# Patient Record
Sex: Female | Born: 1980
Health system: Southern US, Community
[De-identification: ages and names within clinical notes are randomized; demographics above are authoritative.]

## PROBLEM LIST (undated history)

## (undated) DIAGNOSIS — F4310 Post-traumatic stress disorder, unspecified: Secondary | ICD-10-CM

## (undated) DIAGNOSIS — F319 Bipolar disorder, unspecified: Secondary | ICD-10-CM

## (undated) DIAGNOSIS — F419 Anxiety disorder, unspecified: Secondary | ICD-10-CM

---

## 2014-08-12 ENCOUNTER — Emergency Department (HOSPITAL_COMMUNITY)
Admission: EM | Admit: 2014-08-12 | Discharge: 2014-08-12 | Disposition: A | Discharge status: Home / Self Care | Payer: Medicare Other | Attending: Emergency Medicine | Admitting: Emergency Medicine

## 2014-08-12 ENCOUNTER — Encounter (HOSPITAL_COMMUNITY): Payer: Self-pay | Admitting: Neurology

## 2014-08-12 DIAGNOSIS — Z720 Tobacco use: Secondary | ICD-10-CM | POA: Insufficient documentation

## 2014-08-12 DIAGNOSIS — Z8659 Personal history of other mental and behavioral disorders: Secondary | ICD-10-CM | POA: Diagnosis not present

## 2014-08-12 DIAGNOSIS — N390 Urinary tract infection, site not specified: Secondary | ICD-10-CM

## 2014-08-12 DIAGNOSIS — R300 Dysuria: Secondary | ICD-10-CM | POA: Diagnosis present

## 2014-08-12 HISTORY — DX: Anxiety disorder, unspecified: F41.9

## 2014-08-12 HISTORY — DX: Post-traumatic stress disorder, unspecified: F43.10

## 2014-08-12 HISTORY — DX: Bipolar disorder, unspecified: F31.9

## 2014-08-12 LAB — URINE MICROSCOPIC-ADD ON

## 2014-08-12 LAB — URINALYSIS, ROUTINE W REFLEX MICROSCOPIC
Glucose, UA: 250 mg/dL — AB
Hgb urine dipstick: NEGATIVE
KETONES UR: 15 mg/dL — AB
Nitrite: POSITIVE — AB
PROTEIN: NEGATIVE mg/dL
SPECIFIC GRAVITY, URINE: 1.026 (ref 1.005–1.030)
UROBILINOGEN UA: 2 mg/dL — AB (ref 0.0–1.0)
pH: 5 (ref 5.0–8.0)

## 2014-08-12 MED ORDER — FLUCONAZOLE 200 MG PO TABS: 200.0000 mg | ORAL_TABLET | Freq: Every day | ORAL | Status: AC

## 2014-08-12 MED ORDER — CEPHALEXIN 500 MG PO CAPS: 500.0000 mg | ORAL_CAPSULE | Freq: Four times a day (QID) | ORAL | Status: DC

## 2014-08-12 NOTE — ED Provider Notes (Signed)
CSN: [id number]     Arrival date & time 08/12/14  1034 History  This chart was scribed for Comer Locket, PA-C, working with Ernestina Patches, MD by Steva Colder, ED Scribe. The patient was seen in room TR01C/TR01C at 3:10 PM.    Chief Complaint  Patient presents with  . Urinary Tract Infection      The history is provided by the patient. No language interpreter was used.    HPI Comments: Alexis Steele is a 34 y.o. female who presents to the Emergency Department complaining of UTI onset 3 days. Pt reports that she has had a known kidney stone in left kidney for a year that she is unsure if she passed. Pt thinks that she may have an UTI at this time because of her present symptoms. Pt recently moved to the area and does not have a urologist or PCP. She states that she is having associated symptoms of dysuria, malodorous urine, dark urine. Rates discomfort as 6/10. She denies vaginal discharge, vaginal bleeding, abdominal pain, fever, and any other symptoms. Pt is allergic to augmentin and will have hives. No other modifying factors.      Past Medical History  Diagnosis Date  . Bipolar disorder   . PTSD (post-traumatic stress disorder)   . Anxiety    History reviewed. No pertinent past surgical history. No family history on file. History  Substance Use Topics  . Smoking status: Current Every Day Smoker  . Smokeless tobacco: Not on file  . Alcohol Use: Yes   OB History    No data available     Review of Systems  Gastrointestinal: Negative for abdominal pain.  Genitourinary: Positive for dysuria. Negative for vaginal bleeding, vaginal discharge and pelvic pain.  All other systems reviewed and are negative.     Allergies  Augmentin  Home Medications   Prior to Admission medications   Medication Sig Start Date End Date Taking? Authorizing Provider  cephALEXin (KEFLEX) 500 MG capsule Take 1 capsule (500 mg total) by mouth 4 (four) times daily. 08/12/14   Comer Locket,  PA-C  fluconazole (DIFLUCAN) 200 MG tablet Take 1 tablet (200 mg total) by mouth daily. 08/12/14 08/19/14  Comer Locket, PA-C   BP 119/79 mmHg  Pulse 85  Temp(Src) 98.7 F (37.1 C) (Oral)  Resp 18  Ht 5\' 11"  (1.803 m)  Wt 150 lb (68.04 kg)  BMI 20.93 kg/m2  SpO2 98% Physical Exam  Constitutional: She is oriented to person, place, and time. She appears well-developed and well-nourished. No distress.  HENT:  Head: Normocephalic and atraumatic.  Eyes: EOM are normal.  Neck: Neck supple. No tracheal deviation present.  Cardiovascular: Normal rate.   Pulmonary/Chest: Effort normal. No respiratory distress.  Abdominal: Soft. There is tenderness in the suprapubic area. There is no CVA tenderness.  Musculoskeletal: Normal range of motion.  Neurological: She is alert and oriented to person, place, and time.  Skin: Skin is warm and dry.  Psychiatric: She has a normal mood and affect. Her behavior is normal.  Nursing note and vitals reviewed.   ED Course  Procedures (including critical care time) DIAGNOSTIC STUDIES: Oxygen Saturation is 95% on RA, adequate by my interpretation.    COORDINATION OF CARE: 3:13 PM-Discussed treatment plan with pt at bedside and pt agreed to plan.   Labs Review Labs Reviewed  URINALYSIS, ROUTINE W REFLEX MICROSCOPIC (NOT AT [facility]) - Abnormal; Notable for the following:    Color, Urine ORANGE (*)    APPearance TURBID (*)  Glucose, UA 250 (*)    Bilirubin Urine SMALL (*)    Ketones, ur 15 (*)    Urobilinogen, UA 2.0 (*)    Nitrite POSITIVE (*)    Leukocytes, UA MODERATE (*)    All other components within normal limits  URINE MICROSCOPIC-ADD ON - Abnormal; Notable for the following:    Squamous Epithelial / LPF MANY (*)    Bacteria, UA MANY (*)    Casts HYALINE CASTS (*)    All other components within normal limits    Imaging Review No results found.   EKG Interpretation None     Meds given in ED:  Medications - No data to  display  New Prescriptions   CEPHALEXIN (KEFLEX) 500 MG CAPSULE    Take 1 capsule (500 mg total) by mouth 4 (four) times daily.   FLUCONAZOLE (DIFLUCAN) 200 MG TABLET    Take 1 tablet (200 mg total) by mouth daily.   Filed Vitals:   08/12/14 1044 08/12/14 1335 08/12/14 1521  BP: 107/74 134/76 119/79  Pulse: 101 87 85  Temp: 98.7 F (37.1 C)    TempSrc: Oral    Resp: 20 18 18   Height: 5\' 11"  (1.803 m)    Weight: 150 lb (68.04 kg)    SpO2: 95% 95% 98%   MDM  Vitals stable - WNL -afebrile Pt resting comfortably in ED. PE--mild discomfort with palpation to suprapubic region. Otherwise benign abdominal exam. No CVA tenderness. Labwork--evidence of UTI on urinalysis.  DDX--no evidence of urosepsis, infected stone, PID, TOA or other systemic infection. Patient discomfort likely secondary to UTI. Will DC with antibiotic's. Patient requests prescription for Diflucan, we will oblige. Also given referral to call health and wellness for follow-up. No evidence of other acute or emergent pathology. Patient stable and is appropriate for discharge.  I discussed all relevant lab findings and imaging results with pt and they verbalized understanding. Discussed f/u with PCP within 48 hrs and return precautions, pt very amenable to plan.  Final diagnoses:  UTI (lower urinary tract infection)   I personally performed the services described in this documentation, which was scribed in my presence. The recorded information has been reviewed and is accurate.    Comer Locket, PA-C 08/12/14 8329  Ernestina Patches, MD 08/14/14 1059

## 2014-08-12 NOTE — ED Notes (Signed)
Pt not in the main ED waiting room with no response

## 2014-08-12 NOTE — ED Notes (Addendum)
Pt reports dark urine and foul odor for several weeks with burning with urination. Reports known kidney stone for a year.

## 2014-08-12 NOTE — Discharge Instructions (Signed)
You were evaluated in the ED today for your smelly urine. You were found to have UTI. Please take your antibiotic's as directed. Please follow-up with health and wellness for definitive care. Return to ED for worsening symptoms.  Urinary Tract Infection Urinary tract infections (UTIs) can develop anywhere along your urinary tract. Your urinary tract is your body's drainage system for removing wastes and extra water. Your urinary tract includes two kidneys, two ureters, a bladder, and a urethra. Your kidneys are a pair of bean-shaped organs. Each kidney is about the size of your fist. They are located below your ribs, one on each side of your spine. CAUSES Infections are caused by microbes, which are microscopic organisms, including fungi, viruses, and bacteria. These organisms are so small that they can only be seen through a microscope. Bacteria are the microbes that most commonly cause UTIs. SYMPTOMS  Symptoms of UTIs may vary by age and gender of the patient and by the location of the infection. Symptoms in young women typically include a frequent and intense urge to urinate and a painful, burning feeling in the bladder or urethra during urination. Older women and men are more likely to be tired, shaky, and weak and have muscle aches and abdominal pain. A fever may mean the infection is in your kidneys. Other symptoms of a kidney infection include pain in your back or sides below the ribs, nausea, and vomiting. DIAGNOSIS To diagnose a UTI, your caregiver will ask you about your symptoms. Your caregiver also will ask to provide a urine sample. The urine sample will be tested for bacteria and white blood cells. White blood cells are made by your body to help fight infection. TREATMENT  Typically, UTIs can be treated with medication. Because most UTIs are caused by a bacterial infection, they usually can be treated with the use of antibiotics. The choice of antibiotic and length of treatment depend on  your symptoms and the type of bacteria causing your infection. HOME CARE INSTRUCTIONS  If you were prescribed antibiotics, take them exactly as your caregiver instructs you. Finish the medication even if you feel better after you have only taken some of the medication.  Drink enough water and fluids to keep your urine clear or pale yellow.  Avoid caffeine, tea, and carbonated beverages. They tend to irritate your bladder.  Empty your bladder often. Avoid holding urine for long periods of time.  Empty your bladder before and after sexual intercourse.  After a bowel movement, women should cleanse from front to back. Use each tissue only once. SEEK MEDICAL CARE IF:   You have back pain.  You develop a fever.  Your symptoms do not begin to resolve within 3 days. SEEK IMMEDIATE MEDICAL CARE IF:   You have severe back pain or lower abdominal pain.  You develop chills.  You have nausea or vomiting.  You have continued burning or discomfort with urination. MAKE SURE YOU:   Understand these instructions.  Will watch your condition.  Will get help right away if you are not doing well or get worse. Document Released: 10/25/2004 Document Revised: 07/17/2011 Document Reviewed: 02/23/2011 [facility] Patient Information 2015 [place], [place]. This information is not intended to replace advice given to you by your health care provider. Make sure you discuss any questions you have with your health care provider.

## 2015-01-15 ENCOUNTER — Encounter (HOSPITAL_COMMUNITY): Payer: Self-pay | Admitting: Emergency Medicine

## 2015-01-15 ENCOUNTER — Emergency Department (HOSPITAL_COMMUNITY): Payer: Medicare Other

## 2015-01-15 ENCOUNTER — Emergency Department (HOSPITAL_COMMUNITY)
Admission: EM | Admit: 2015-01-15 | Discharge: 2015-01-15 | Disposition: A | Discharge status: Home / Self Care | Payer: Medicare Other | Attending: Emergency Medicine | Admitting: Emergency Medicine

## 2015-01-15 DIAGNOSIS — M62838 Other muscle spasm: Secondary | ICD-10-CM | POA: Insufficient documentation

## 2015-01-15 DIAGNOSIS — F4310 Post-traumatic stress disorder, unspecified: Secondary | ICD-10-CM | POA: Insufficient documentation

## 2015-01-15 DIAGNOSIS — F17200 Nicotine dependence, unspecified, uncomplicated: Secondary | ICD-10-CM | POA: Insufficient documentation

## 2015-01-15 DIAGNOSIS — Y92410 Unspecified street and highway as the place of occurrence of the external cause: Secondary | ICD-10-CM | POA: Diagnosis not present

## 2015-01-15 DIAGNOSIS — S0592XA Unspecified injury of left eye and orbit, initial encounter: Secondary | ICD-10-CM | POA: Insufficient documentation

## 2015-01-15 DIAGNOSIS — F419 Anxiety disorder, unspecified: Secondary | ICD-10-CM | POA: Diagnosis not present

## 2015-01-15 DIAGNOSIS — Z79899 Other long term (current) drug therapy: Secondary | ICD-10-CM | POA: Insufficient documentation

## 2015-01-15 DIAGNOSIS — S199XXA Unspecified injury of neck, initial encounter: Secondary | ICD-10-CM | POA: Diagnosis not present

## 2015-01-15 DIAGNOSIS — Y998 Other external cause status: Secondary | ICD-10-CM | POA: Diagnosis not present

## 2015-01-15 DIAGNOSIS — Y9389 Activity, other specified: Secondary | ICD-10-CM | POA: Insufficient documentation

## 2015-01-15 DIAGNOSIS — F319 Bipolar disorder, unspecified: Secondary | ICD-10-CM | POA: Diagnosis not present

## 2015-01-15 MED ORDER — METHOCARBAMOL 500 MG PO TABS: 500.0000 mg | ORAL_TABLET | Freq: Two times a day (BID) | ORAL | Status: DC

## 2015-01-15 MED ORDER — KETOROLAC TROMETHAMINE 30 MG/ML IJ SOLN
30.0000 mg | Freq: Once | INTRAMUSCULAR | Status: AC
  Administered 2015-01-15: 30 mg via INTRAVENOUS
  Filled 2015-01-15: qty 1

## 2015-01-15 MED ORDER — OXYCODONE-ACETAMINOPHEN 5-325 MG PO TABS: 1.0000 | ORAL_TABLET | ORAL | Status: DC | PRN

## 2015-01-15 MED ORDER — ONDANSETRON HCL 4 MG/2ML IJ SOLN
4.0000 mg | Freq: Once | INTRAMUSCULAR | Status: AC
  Administered 2015-01-15: 4 mg via INTRAVENOUS
  Filled 2015-01-15: qty 2

## 2015-01-15 MED ORDER — FENTANYL CITRATE (PF) 100 MCG/2ML IJ SOLN
100.0000 ug | Freq: Once | INTRAMUSCULAR | Status: AC
  Administered 2015-01-15: 100 ug via INTRAVENOUS
  Filled 2015-01-15: qty 2

## 2015-01-15 MED ORDER — OXYCODONE-ACETAMINOPHEN 5-325 MG PO TABS
1.0000 | ORAL_TABLET | Freq: Once | ORAL | Status: AC
Start: 2015-01-15 — End: 2015-01-15
  Administered 2015-01-15: 1 via ORAL
  Filled 2015-01-15: qty 1

## 2015-01-15 MED ORDER — IBUPROFEN 800 MG PO TABS: 800.0000 mg | ORAL_TABLET | Freq: Three times a day (TID) | ORAL | Status: DC
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
## 2015-01-15 NOTE — ED Notes (Signed)
PA @ bedside.
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
## 2015-01-15 NOTE — ED Notes (Signed)
Pt given water and ice pack after discussed with Dr. Tyrone Nine.

## 2015-02-17 DIAGNOSIS — M25531 Pain in right wrist: Secondary | ICD-10-CM | POA: Diagnosis not present

## 2015-02-17 DIAGNOSIS — A630 Anogenital (venereal) warts: Secondary | ICD-10-CM | POA: Diagnosis not present

## 2015-02-17 DIAGNOSIS — M21619 Bunion of unspecified foot: Secondary | ICD-10-CM | POA: Diagnosis not present

## 2015-03-02 DIAGNOSIS — F3132 Bipolar disorder, current episode depressed, moderate: Secondary | ICD-10-CM | POA: Diagnosis not present

## 2015-03-02 DIAGNOSIS — F4310 Post-traumatic stress disorder, unspecified: Secondary | ICD-10-CM | POA: Diagnosis not present

## 2015-03-30 DIAGNOSIS — M21612 Bunion of left foot: Secondary | ICD-10-CM | POA: Diagnosis not present

## 2015-03-30 DIAGNOSIS — M21611 Bunion of right foot: Secondary | ICD-10-CM | POA: Diagnosis not present

## 2015-04-14 DIAGNOSIS — F4310 Post-traumatic stress disorder, unspecified: Secondary | ICD-10-CM | POA: Diagnosis not present

## 2015-04-14 DIAGNOSIS — F3132 Bipolar disorder, current episode depressed, moderate: Secondary | ICD-10-CM | POA: Diagnosis not present

## 2015-05-02 DIAGNOSIS — M21612 Bunion of left foot: Secondary | ICD-10-CM | POA: Diagnosis not present

## 2015-05-02 DIAGNOSIS — M21611 Bunion of right foot: Secondary | ICD-10-CM | POA: Diagnosis not present

## 2015-05-04 ENCOUNTER — Other Ambulatory Visit: Payer: Medicare Other

## 2015-05-25 ENCOUNTER — Encounter: Payer: Self-pay | Admitting: *Deleted

## 2015-05-26 ENCOUNTER — Other Ambulatory Visit: Payer: Medicare Other

## 2015-06-13 ENCOUNTER — Encounter: Payer: Medicare Other | Admitting: Obstetrics and Gynecology

## 2015-06-14 ENCOUNTER — Other Ambulatory Visit: Payer: Self-pay

## 2015-06-14 ENCOUNTER — Other Ambulatory Visit: Payer: Self-pay | Admitting: Internal Medicine

## 2015-06-14 DIAGNOSIS — K740 Hepatic fibrosis: Secondary | ICD-10-CM | POA: Diagnosis not present

## 2015-06-14 DIAGNOSIS — Z7251 High risk heterosexual behavior: Secondary | ICD-10-CM

## 2015-06-14 DIAGNOSIS — B182 Chronic viral hepatitis C: Secondary | ICD-10-CM | POA: Diagnosis not present

## 2015-06-14 LAB — PROTIME-INR
INR: 1.05 (ref <1.50)
Prothrombin Time: 13.8 seconds (ref 11.6–15.2)

## 2015-06-15 LAB — IRON: Iron: 185 ug/dL (ref 40–190)

## 2015-06-15 LAB — HEPATITIS B CORE ANTIBODY, TOTAL: HEP B C TOTAL AB: NONREACTIVE

## 2015-06-15 LAB — HEPATITIS B SURFACE ANTIGEN: Hepatitis B Surface Ag: NEGATIVE

## 2015-06-15 LAB — HEPATITIS A ANTIBODY, TOTAL: HEP A TOTAL AB: NONREACTIVE

## 2015-06-15 LAB — HEPATITIS B SURFACE ANTIBODY,QUALITATIVE: HEP B S AB: POSITIVE — AB

## 2015-06-16 DIAGNOSIS — N898 Other specified noninflammatory disorders of vagina: Secondary | ICD-10-CM | POA: Diagnosis not present

## 2015-06-16 DIAGNOSIS — K759 Inflammatory liver disease, unspecified: Secondary | ICD-10-CM | POA: Diagnosis not present

## 2015-06-16 DIAGNOSIS — Z720 Tobacco use: Secondary | ICD-10-CM | POA: Diagnosis not present

## 2015-06-16 DIAGNOSIS — A630 Anogenital (venereal) warts: Secondary | ICD-10-CM | POA: Diagnosis not present

## 2015-06-16 DIAGNOSIS — M21619 Bunion of unspecified foot: Secondary | ICD-10-CM | POA: Diagnosis not present

## 2015-06-17 LAB — LIVER FIBROSIS, FIBROTEST-ACTITEST
ALPHA-2-MACROGLOBULIN: 172 mg/dL (ref 106–279)
ALT: 75 U/L — ABNORMAL HIGH (ref 6–29)
Apolipoprotein A1: 154 mg/dL (ref 101–198)
Bilirubin: 0.4 mg/dL (ref 0.2–1.2)
FIBROSIS SCORE: 0.09
GGT: 42 U/L (ref 3–50)
Haptoglobin: 135 mg/dL (ref 43–212)
NECROINFLAMMAT ACT SCORE: 0.37
REFERENCE ID: 1524235

## 2015-06-29 ENCOUNTER — Encounter: Payer: Medicare Other | Admitting: Internal Medicine

## 2015-07-11 ENCOUNTER — Encounter: Payer: Self-pay | Admitting: Family Medicine

## 2015-07-11 ENCOUNTER — Ambulatory Visit (INDEPENDENT_AMBULATORY_CARE_PROVIDER_SITE_OTHER): Payer: Medicare Other | Admitting: Family Medicine

## 2015-07-11 VITALS — BP 116/87 | HR 88 | Ht 71.0 in | Wt 165.0 lb

## 2015-07-11 DIAGNOSIS — B1920 Unspecified viral hepatitis C without hepatic coma: Secondary | ICD-10-CM | POA: Insufficient documentation

## 2015-07-11 DIAGNOSIS — B182 Chronic viral hepatitis C: Secondary | ICD-10-CM | POA: Diagnosis not present

## 2015-07-11 DIAGNOSIS — R87612 Low grade squamous intraepithelial lesion on cytologic smear of cervix (LGSIL): Secondary | ICD-10-CM

## 2015-07-11 DIAGNOSIS — A630 Anogenital (venereal) warts: Secondary | ICD-10-CM | POA: Insufficient documentation

## 2015-07-11 DIAGNOSIS — B977 Papillomavirus as the cause of diseases classified elsewhere: Secondary | ICD-10-CM

## 2015-07-11 DIAGNOSIS — IMO0002 Reserved for concepts with insufficient information to code with codable children: Secondary | ICD-10-CM

## 2015-07-11 DIAGNOSIS — R87629 Unspecified abnormal cytological findings in specimens from vagina: Secondary | ICD-10-CM

## 2015-07-11 DIAGNOSIS — R87810 Cervical high risk human papillomavirus (HPV) DNA test positive: Secondary | ICD-10-CM | POA: Diagnosis not present

## 2015-07-11 NOTE — Progress Notes (Signed)
Patient ID: Alexis Steele, female   DOB: [birth year], 35 y.o.   MRN: [id number]   CLINIC ENCOUNTER NOTE  History:  35 y.o.  here today for possible colposcopy. 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  Patient reports 2 prior abnormal pap smears at age 35 and 35. She also had an abdominal hysterectomy at age 35 for endometriosis.  She denies any abnormal vaginal discharge, bleeding, pelvic pain or other concerns. She has warts on the right lower aspect of her vagina that she would like treated.   Past Medical History  Diagnosis Date  . Bipolar disorder ([place])   . PTSD (post-traumatic stress disorder)   . Anxiety     No past surgical history on file.  The following portions of the patient's history were reviewed and updated as appropriate: allergies, current medications, past family history, past medical history, past social history, past surgical history and problem list.    Review of Systems:  Pertinent items noted in HPI and remainder of comprehensive ROS otherwise negative.  Objective:  Physical Exam BP 116/87 mmHg  Pulse 88  Ht 5\' 11"  (1.803 m)  Wt 165 lb (74.844 kg)  BMI 23.02 kg/m2 CONSTITUTIONAL: Well-developed, well-nourished female in no acute distress.  HENT:  Normocephalic, atraumatic. External right and left ear normal. Oropharynx is clear and moist EYES: Conjunctivae and EOM are normal. Pupils are equal, round, and reactive to light. No scleral icterus.  NECK: Normal range of motion, supple, no masses SKIN: Skin is warm and dry. No rash noted. Not diaphoretic. No erythema. No pallor. [place]: Alert and oriented to person, place, and time. Normal reflexes, muscle tone coordination. No cranial nerve deficit noted. PSYCHIATRIC: Normal mood and affect. Normal behavior. Normal judgment and thought content. CARDIOVASCULAR: Normal heart rate noted RESPIRATORY: Effort and breath sounds normal, no problems with respiration noted ABDOMEN: Soft, no distention noted.     PELVIC: Normal appearing external genitalia. Several small warts at base of vagina.  Bimanual exam performed and unable to feel cervix. Speculum exam confirmed surgically absent cervix. Normal appearing vaginal mucosa without lesion. No abnormal discharge noted. No adnexal tenderness. MUSCULOSKELETAL: Normal range of motion. No edema noted.  Labs and Imaging No results found.  Assessment & Plan:  1. Abnormal Pap smear of vagina - No overt lesions on vaginal walls. Given hysterectomy with removal of the cervix patient does not need biopsies at this time. Discussed with Dr. Roselie Awkward who was in agreement with plan to repeat pap in 1 year.  - Repeat pap in 1 year  2. Genital warts -Treated with TCA today - Repeat in 4-6 weeks if not improved.    Routine preventative health maintenance measures emphasized. Please refer to After Visit Summary for other counseling recommendations.   Return in about 4 weeks (around 08/08/2015), or if symptoms worsen or fail to improve.  Total face-to-face time with patient: 30 minutes. Over 50% of encounter was spent on counseling and coordination of care.

## 2015-07-11 NOTE — Patient Instructions (Signed)
Genital Warts Genital warts are a common STD (sexually transmitted disease). They may appear as small bumps on the tissues of the genital area or anal area. Sometimes, they can become irritated and cause pain. Genital warts are easily passed to other people through sexual contact. Getting treatment is important because genital warts can lead to other problems. In females, the virus that causes genital warts may increase the risk of cervical cancer. CAUSES Genital warts are caused by a virus that is called human papillomavirus (HPV). HPV is spread by having unprotected sex with an infected person. It can be spread through vaginal, anal, and oral sex. Many people do not know that they are infected. They may be infected for years without problems. However, even if they do not have problems, they can pass the infection to their sexual partners. RISK FACTORS Genital warts are more likely to develop in:  People who have unprotected sex.  People who have multiple sexual partners.  People who become sexually active before they are 37 years of age.  Men who are not circumcised.  Women who have a female sexual partner who is not circumcised.  People who have a weakened body defense system (immune system) due to disease or medicine.  People who smoke. SYMPTOMS Symptoms of genital warts include:  Small growths in the genital area or anal area. These warts often grow in clusters.  Itching and irritation in the genital area or anal area.  Bleeding from the warts.  Painful sexual intercourse. DIAGNOSIS Genital warts can usually be diagnosed from their appearance on the vagina, vulva, penis, perineum, anus, or rectum. Tests may also be done, such as:  Biopsy. A tissue sample is removed so it can be looked at under a microscope.  Colposcopy. In females, a magnifying tool is used to examine the vagina and cervix. Certain solutions may be used to make the HPV cells change color so they can be seen more  easily.  A Pap test in females.  Tests for other STDs. TREATMENT Treatment for genital warts may include:  Applying prescription medicines to the warts. These may be solutions or creams.  Freezing the warts with liquid nitrogen (cryotherapy).  Burning the warts with:  Laser treatment.  An electrified probe (electrocautery).  Injecting a substance (Candida antigen or Trichophyton antigen) into the warts to help the body's immune system to fight off the warts.  Interferon injections.  Surgery to remove the warts. HOME CARE INSTRUCTIONS Medicines  Apply over-the-counter and prescription medicines only as told by your health care provider.  Do not treat genital warts with medicines that are used for treating hand warts.  Talk with your health care provider about using over-the-counter anti-itch creams. General Instructions  Do not touch or scratch the warts.  Do not have sex until your treatment has been completed.  Tell your current and past sexual partners about your condition because they may also need treatment.  Keep all follow-up visits as told by your health care provider. This is important.  After treatment, use condoms during sex to prevent future infections. Other Instructions for Women  Women who have genital warts might need increased screening for cervical cancer. This type of cancer is slow growing and can be cured if it is found early. Chances of developing cervical cancer are increased with HPV.  If you become pregnant, tell your health care provider that you have had HPV. Your health care provider will monitor you closely during pregnancy to be sure that your  baby is safe. PREVENTION Talk with your health care provider about getting the HPV vaccines. These vaccines prevent some HPV infections and cancers. It is recommended that the vaccine be given to males and females who are 9-26 years of age. It will not work if you already have HPV, and it is not  recommended for pregnant women. SEEK MEDICAL CARE IF:  You have redness, swelling, or pain in the area of the treated skin.  You have a fever.  You feel generally ill.  You feel lumps in and around your genital area or anal area.  You have bleeding in your genital area or anal area.  You have pain during sexual intercourse.   This information is not intended to replace advice given to you by your health care provider. Make sure you discuss any questions you have with your health care provider.   Document Released: 01/13/2000 Document Revised: 10/06/2014 Document Reviewed: 04/12/2014 Elsevier Interactive Patient Education 2016 [employer]vier Inc.  

## 2015-07-12 DIAGNOSIS — F4310 Post-traumatic stress disorder, unspecified: Secondary | ICD-10-CM | POA: Diagnosis not present

## 2015-07-14 ENCOUNTER — Ambulatory Visit (INDEPENDENT_AMBULATORY_CARE_PROVIDER_SITE_OTHER): Payer: Medicare Other | Admitting: Internal Medicine

## 2015-07-14 ENCOUNTER — Encounter: Payer: Self-pay | Admitting: Internal Medicine

## 2015-07-14 VITALS — BP 136/83 | HR 101 | Temp 98.6°F | Wt 163.0 lb

## 2015-07-14 DIAGNOSIS — Z7251 High risk heterosexual behavior: Secondary | ICD-10-CM | POA: Diagnosis not present

## 2015-07-14 DIAGNOSIS — Z23 Encounter for immunization: Secondary | ICD-10-CM

## 2015-07-14 DIAGNOSIS — B182 Chronic viral hepatitis C: Secondary | ICD-10-CM | POA: Diagnosis not present

## 2015-07-14 LAB — CBC WITH DIFFERENTIAL/PLATELET
BASOS ABS: 44 {cells}/uL (ref 0–200)
Basophils Relative: 1 %
Eosinophils Absolute: 88 cells/uL (ref 15–500)
Eosinophils Relative: 2 %
HCT: 40.9 % (ref 35.0–45.0)
HEMOGLOBIN: 14.3 g/dL (ref 11.7–15.5)
LYMPHS ABS: 1804 {cells}/uL (ref 850–3900)
Lymphocytes Relative: 41 %
MCH: 33.5 pg — AB (ref 27.0–33.0)
MCHC: 35 g/dL (ref 32.0–36.0)
MCV: 95.8 fL (ref 80.0–100.0)
MONOS PCT: 8 %
MPV: 10.3 fL (ref 7.5–12.5)
Monocytes Absolute: 352 cells/uL (ref 200–950)
NEUTROS ABS: 2112 {cells}/uL (ref 1500–7800)
Neutrophils Relative %: 48 %
Platelets: 289 10*3/uL (ref 140–400)
RBC: 4.27 MIL/uL (ref 3.80–5.10)
RDW: 12.9 % (ref 11.0–15.0)
WBC: 4.4 10*3/uL (ref 3.8–10.8)

## 2015-07-14 NOTE — Progress Notes (Signed)
Patient ID: Alexis Steele, female   DOB: 09/11/1980, 35 y.o.   MRN: [id number]    [facility] for Infectious Disease   CC: consideration for treatment for chronic hepatitis C  HPI:  Alexis Steele is a 35 y.o. female with history of bipolar disease, PTSD who presents for initial evaluation and management of chronic hepatitis C.  Patient tested positive initially last year. Hepatitis C-associated risk factors present are: she had injection drug use, last used 12 months ago. Patient denies no longer using illicit drug. Patient has had other studies performed. Results: fibrosure which showed no fibrosis. Patient has not had prior treatment for Hepatitis C. Patient does have a past history of liver disease. Patient does not have have a family history of liver disease. Patient does not have associated signs or symptoms related to liver disease.   Records reviewed from epic and primary care. She receives her [facility] health care from [facility]  Patient does not have documented immunity to Hepatitis A. Patient does have documented immunity to Hepatitis B.    Review of Systems:   rash, involving torso, extremities, face occasionally All other systems reviewed and are negative      Past Medical History  Diagnosis Date  . Bipolar disorder ([place])   . PTSD (post-traumatic stress disorder)   . Anxiety   - hx of kidney stone - s/p bladder surgery, removal of scar tissue  Prior to Admission medications   Medication Sig Start Date End Date Taking? Authorizing Provider  Brexpiprazole (REXULTI) 1 MG TABS Take 1 tablet by mouth daily.   Yes Historical Provider, MD  busPIRone (BUSPAR) 10 MG tablet Take 10 mg by mouth 3 (three) times daily.   Yes Historical Provider, MD  gabapentin (NEURONTIN) 400 MG capsule Take 400 mg by mouth 4 (four) times daily.   Yes Historical Provider, MD  lamoTRIgine (LAMICTAL) 200 MG tablet Take 200 mg by mouth at bedtime.   Yes Historical Provider, MD  PARoxetine (PAXIL) 40 MG tablet  Take 40 mg by mouth every morning.   Yes Historical Provider, MD  QUEtiapine (SEROQUEL) 300 MG tablet Take 300 mg by mouth at bedtime.   Yes Historical Provider, MD  sucralfate (CARAFATE) 1 g tablet Take 1 g by mouth 4 (four) times daily.   Yes Historical Provider, MD    Allergies  Allergen Reactions  . Augmentin [Amoxicillin-Pot Clavulanate]     Social History  Substance Use Topics  . Smoking status: Current Every Day Smoker  . Smokeless tobacco: None  . Alcohol Use: Yes  - 2 drinks in the last 30 days  No family history on file.    Objective:  BP 136/83 mmHg  Pulse 101  Temp(Src) 98.6 F (37 C) (Oral)  Wt 163 lb (73.936 kg) Physical Exam  Constitutional:  oriented to person, place, and time. appears well-developed and well-nourished. No distress.  HENT: [place]/AT, PERRLA, no scleral icterus Mouth/Throat: Oropharynx is clear and moist. No oropharyngeal exudate.  Cardiovascular: Normal rate, regular rhythm and normal heart sounds. Exam reveals no gallop and no friction rub.  No murmur heard.  Pulmonary/Chest: Effort normal and breath sounds normal. No respiratory distress.  has no wheezes.  Neck = supple, no nuchal rigidity Abdominal: Soft. Bowel sounds are normal.  exhibits no distension. There is no tenderness.  Lymphadenopathy: no cervical adenopathy. No axillary adenopathy Neurological: alert and oriented to person, place, and time.  Skin: Skin is warm and dry. No rash noted. Subtle blanching erythema to side of neck.  Top of quads. Psychiatric: a normal mood and affect.  behavior is normal.   Laboratory Genotype: No results found for: HCVGENOTYPE HCV viral load: No results found for: HCVQUANT No results found for: WBC, HGB, HCT, MCV, PLT No results found for: CREATININE, BUN, NA, K, CL, CO2  Lab Results  Component Value Date   ALT 75* 06/14/2015     Labs and history reviewed and show CHILD-PUGH   5-6 points: Child class A 7-9 points: Child class B 10-15  points: Child class C  Lab Results  Component Value Date   INR 1.05 06/14/2015    Assessment: New Patient with Chronic Hepatitis C ,without hepatic coma, untreated.  I discussed with the patient the lab findings that confirm chronic hepatitis C as well as the natural history and progression of disease including about 30% of people who develop cirrhosis of the liver if left untreated and once cirrhosis is established there is a 2-7% risk per year of liver cancer and liver failure.  I discussed the importance of treatment and benefits in reducing the risk, even if significant liver fibrosis exists.   Plan: 1) Patient counseled extensively on limiting acetaminophen to no more than 2 grams daily, avoidance of alcohol. 2) Transmission discussed with patient including sexual transmission, sharing razors and toothbrush.   3) Will need referral for substance abuse counseling: no. Currently getting [facility]. Will drug test at next visit to ensure no active illicit drug use. Reportedly she has been abstinent/drug free x 12 months 4) Will prescribe Harvoni for 12 weeks if she has detectable viral load 6) Hepatitis A vaccine : yes, 1st dose today 7) Hepatitis B vaccine: not needed 8) Pneumovax vaccine if concern for cirrhosis 9) Further work up to include liver staging with elastography 10) will follow up after review of lab tests to determine if need to start tx

## 2015-07-15 LAB — COMPLETE METABOLIC PANEL WITH GFR
ALBUMIN: 4.5 g/dL (ref 3.6–5.1)
ALK PHOS: 97 U/L (ref 33–115)
ALT: 145 U/L — ABNORMAL HIGH (ref 6–29)
AST: 75 U/L — ABNORMAL HIGH (ref 10–30)
BILIRUBIN TOTAL: 0.3 mg/dL (ref 0.2–1.2)
BUN: 8 mg/dL (ref 7–25)
CO2: 23 mmol/L (ref 20–31)
Calcium: 9.5 mg/dL (ref 8.6–10.2)
Chloride: 107 mmol/L (ref 98–110)
Creat: 0.78 mg/dL (ref 0.50–1.10)
Glucose, Bld: 78 mg/dL (ref 65–99)
Potassium: 4.2 mmol/L (ref 3.5–5.3)
Sodium: 140 mmol/L (ref 135–146)
TOTAL PROTEIN: 7.5 g/dL (ref 6.1–8.1)

## 2015-07-15 LAB — HEPATITIS C RNA QUANTITATIVE
HCV Quantitative Log: 5.06 {Log} — ABNORMAL HIGH (ref <1.18)
HCV Quantitative: 114883 IU/mL — ABNORMAL HIGH (ref <15)

## 2015-07-15 LAB — HEPATITIS C ANTIBODY: HCV Ab: REACTIVE — AB

## 2015-07-19 LAB — HEPATITIS C RNA QUANTITATIVE
HCV QUANT LOG: 5.15 {Log} — AB (ref <1.18)
HCV Quantitative: 140647 IU/mL — ABNORMAL HIGH (ref <15)

## 2015-07-19 LAB — HEPATITIS C GENOTYPE

## 2015-07-26 ENCOUNTER — Telehealth: Payer: Self-pay

## 2015-08-10 ENCOUNTER — Encounter: Payer: Self-pay | Admitting: Internal Medicine

## 2015-08-10 ENCOUNTER — Ambulatory Visit (INDEPENDENT_AMBULATORY_CARE_PROVIDER_SITE_OTHER): Payer: Medicare Other | Admitting: Internal Medicine

## 2015-08-10 VITALS — BP 113/80 | HR 93 | Temp 98.6°F | Wt 165.0 lb

## 2015-08-10 DIAGNOSIS — B182 Chronic viral hepatitis C: Secondary | ICD-10-CM | POA: Diagnosis not present

## 2015-08-10 MED ORDER — SOFOSBUVIR-VELPATASVIR 400-100 MG PO TABS: 1.0000 | ORAL_TABLET | Freq: Every day | ORAL | Status: AC

## 2015-08-10 NOTE — Progress Notes (Signed)
  RFV: follow up for chronic hepatitis c Subjective:    Patient ID: Alexis Steele, female    DOB: 10-17-1980, 35 y.o.   MRN: [id number]  HPI  35yo F with chronic hepatitis c without hepatic coma. GT2b. She is doing well since last visit. She is starting to attend counseling at [facility] who also manages bipolar disease. She is still undergoing bereavement since death of her mother. She maintains sobriety for 15 months. No IVDU.  Allergies  Allergen Reactions  . Augmentin [Amoxicillin-Pot Clavulanate]   ' Current Outpatient Prescriptions on File Prior to Visit  Medication Sig Dispense Refill  . Brexpiprazole (REXULTI) 1 MG TABS Take 1 tablet by mouth daily.    . busPIRone (BUSPAR) 10 MG tablet Take 10 mg by mouth 3 (three) times daily.    Marland Kitchen gabapentin (NEURONTIN) 400 MG capsule Take 400 mg by mouth 4 (four) times daily.    Marland Kitchen lamoTRIgine (LAMICTAL) 200 MG tablet Take 200 mg by mouth at bedtime.    Marland Kitchen PARoxetine (PAXIL) 40 MG tablet Take 40 mg by mouth every morning.    Marland Kitchen QUEtiapine (SEROQUEL) 300 MG tablet Take 300 mg by mouth at bedtime.    . sucralfate (CARAFATE) 1 g tablet Take 1 g by mouth 4 (four) times daily.     No current facility-administered medications on file prior to visit.   Active Ambulatory Problems    Diagnosis Date Noted  . Abnormal Pap smear of vagina and vaginal HPV 07/11/2015  . Genital warts 07/11/2015  . Hepatitis C 07/11/2015   Resolved Ambulatory Problems    Diagnosis Date Noted  . No Resolved Ambulatory Problems   Past Medical History  Diagnosis Date  . Bipolar disorder ([place])   . PTSD (post-traumatic stress disorder)   . Anxiety      Review of Systems + anxiety/depression. Ongoing lip lesion.    Objective:   Physical Exam BP 113/80 mmHg  Pulse 93  Temp(Src) 98.6 F (37 C) (Oral)  Wt 165 lb (74.844 kg) Physical Exam  Constitutional:  oriented to person, place, and time. appears well-developed and well-nourished. No distress.  HENT: [place]/AT, PERRLA,  no scleral icterus. Left corner of lip, raised, pedunculated lesion Mouth/Throat: Oropharynx is clear and moist. No oropharyngeal exudate.  Cardiovascular: Normal rate, regular rhythm and normal heart sounds. Exam reveals no gallop and no friction rub.  No murmur heard.  Pulmonary/Chest: Effort normal and breath sounds normal. No respiratory distress.  has no wheezes.  Neck = supple, no nuchal rigidity Abdominal: Soft. Bowel sounds are normal.  exhibits no distension. There is no tenderness.  Lymphadenopathy: no cervical adenopathy. No axillary adenopathy Neurological: alert and oriented to person, place, and time.  Skin: Skin is warm and dry. No rash noted. No erythema. tattoos  Psychiatric: a normal mood and affect.  behavior is normal.       Assessment & Plan:  Chronic hepatitis c without hepatic coma- limit acetominophen and alcohol use. Will also ask her to cut down on drinking energy drinks (unclear if there would be any drug interaction with hep c tx) Will apply for epclusa  - will give counseling on how to take medicaiton  GERD = will ask her to stop carafate if symptoms are not significant  hpv lip lesion = recommend to see dermatologist for assessment and treatment since near corner of mouth, [place]million border

## 2015-08-12 MED FILL — *EPCLUSA 400 MG-100 MG TAB: 400-100 | 28 days supply | Qty: 28 | Fill #0

## 2015-08-15 ENCOUNTER — Encounter: Payer: Self-pay | Admitting: Pharmacy Technician

## 2015-08-16 ENCOUNTER — Encounter: Payer: Self-pay | Admitting: Pharmacy Technician

## 2015-08-25 DIAGNOSIS — K121 Other forms of stomatitis: Secondary | ICD-10-CM | POA: Diagnosis not present

## 2015-08-31 ENCOUNTER — Ambulatory Visit (INDEPENDENT_AMBULATORY_CARE_PROVIDER_SITE_OTHER): Payer: Medicare Other | Admitting: Pharmacist Clinician (PhC)/ Clinical Pharmacy Specialist

## 2015-08-31 ENCOUNTER — Other Ambulatory Visit: Payer: Medicare Other

## 2015-08-31 DIAGNOSIS — B1920 Unspecified viral hepatitis C without hepatic coma: Secondary | ICD-10-CM | POA: Diagnosis not present

## 2015-08-31 DIAGNOSIS — B182 Chronic viral hepatitis C: Secondary | ICD-10-CM | POA: Diagnosis present

## 2015-08-31 NOTE — Progress Notes (Signed)
Patient ID: Alexis Steele, female   DOB: September 20, 1980, 35 y.o.   MRN: [id number] Agreed with Renee's note. Alexis Steele got lab too soon because she mistakenly thought it was supposed to be done today. We can repeat at the next appt if needed.

## 2015-08-31 NOTE — Progress Notes (Signed)
Patient ID: Alexis Steele, female   DOB: [birth year], 35 y.o.   MRN: [id number]  HPI: Alexis Steele is a 35 y.o. female who presents for follow-up of chronic hepatitis C, genotype 2b. Patient tested positive initially last year. Hepatitis C-associated risk factors: IV drug use, last used over 12 months ago. Patient denies current illicit drug use. She started [place] on 7/19.  Lab Results  Component Value Date   HCVGENOTYPE 2b 07/14/2015    Allergies: Allergies  Allergen Reactions  . Augmentin [Amoxicillin-Pot Clavulanate]     Past Medical History: Past Medical History:  Diagnosis Date  . Anxiety   . Bipolar disorder ([place])   . PTSD (post-traumatic stress disorder)     Social History: Social History   Social History  . Marital status: Single    Spouse name: N/A  . Number of children: N/A  . Years of education: N/A   Social History Main Topics  . Smoking status: Current Every Day Smoker  . Smokeless tobacco: Not on file  . Alcohol use Yes  . Drug use: Unknown  . Sexual activity: Not on file   Other Topics Concern  . Not on file   Social History Narrative  . No narrative on file    Labs: Hep B S Ab (no units)  Date Value  06/14/2015 POS (A)   Hepatitis B Surface Ag (no units)  Date Value  06/14/2015 NEGATIVE   HCV Ab (no units)  Date Value  07/14/2015 REACTIVE (A)    Lab Results  Component Value Date   HCVGENOTYPE 2b 07/14/2015    Hepatitis C RNA quantitative Latest Ref Rng & Units 07/14/2015 07/14/2015  HCV Quantitative <15 IU/mL 140,647(H) 114,883(H)  HCV Quantitative Log <1.18 log 10 5.15(H) 5.06(H)    AST (U/L)  Date Value  07/14/2015 75 (H)   ALT (U/L)  Date Value  07/14/2015 145 (H)  06/14/2015 75 (H)   INR (no units)  Date Value  06/14/2015 1.05    CrCl: CrCl cannot be calculated (Unknown ideal weight.).  Fibrosis Score: 0.09 - F0  Previous Treatment Regimen: none  Assessment: Patient reports not missing any doses; takes at  11pm every day. Denies any side effects associated with Epclusa. Endorses headaches, fatigue, and constipation since cutting back on caffeine. Advised her to use ibuprofen prn headaches and docusate or Miralax prn constipation. She reports stopping Carafate to avoid interaction with Epclusa. Reviewed all medications; no other interactions.  Although she was not scheduled, patient ended up getting labs today.  Recommendations: -Continue Epclusa for total 12 week duration -F/u today's labs -F/u visit with Dr. Baxter Flattery on 8/17   Gwenlyn Perking, PharmD PGY1 Pharmacy Resident Pager: [phone] 08/31/2015 12:42 PM

## 2015-08-31 NOTE — Patient Instructions (Signed)
Cont Epclusa 1 daily x 3 mo Come back to see Dr. Baxter Flattery in mid August

## 2015-09-01 ENCOUNTER — Ambulatory Visit: Payer: Medicare Other

## 2015-09-02 LAB — HEPATITIS C RNA QUANTITATIVE: HCV Quantitative: NOT DETECTED IU/mL (ref <15)

## 2015-09-08 MED FILL — *EPCLUSA 400 MG-100 MG TAB: 400-100 | 28 days supply | Qty: 28 | Fill #1

## 2015-09-15 ENCOUNTER — Ambulatory Visit: Payer: Medicare Other | Admitting: Internal Medicine

## 2015-09-15 ENCOUNTER — Telehealth: Payer: Self-pay | Admitting: Pharmacist Clinician (PhC)/ Clinical Pharmacy Specialist

## 2015-09-15 NOTE — Telephone Encounter (Signed)
Alexis Steele was supposed to come today to get repeated hep C VL today but had car trouble. She accidentally got too soon at the pharmacy appt on 8/2 after only 2 wks of Epclusa. The VL came back undetectable even after only 2 wks. Told that she doesn't need to come in today but make sure to f/u with Dr. Baxter Flattery in Oct.

## 2015-09-27 DIAGNOSIS — F3132 Bipolar disorder, current episode depressed, moderate: Secondary | ICD-10-CM | POA: Diagnosis not present

## 2015-09-27 DIAGNOSIS — F4310 Post-traumatic stress disorder, unspecified: Secondary | ICD-10-CM | POA: Diagnosis not present

## 2015-10-05 MED FILL — *EPCLUSA 400 MG-100 MG TAB: 400-100 | 28 days supply | Qty: 28 | Fill #2

## 2015-10-26 DIAGNOSIS — D2339 Other benign neoplasm of skin of other parts of face: Secondary | ICD-10-CM | POA: Diagnosis not present

## 2015-10-26 DIAGNOSIS — L300 Nummular dermatitis: Secondary | ICD-10-CM | POA: Diagnosis not present

## 2015-10-26 DIAGNOSIS — D100 Benign neoplasm of lip: Secondary | ICD-10-CM | POA: Diagnosis not present

## 2015-11-01 DIAGNOSIS — B079 Viral wart, unspecified: Secondary | ICD-10-CM | POA: Diagnosis not present

## 2015-11-08 ENCOUNTER — Ambulatory Visit (INDEPENDENT_AMBULATORY_CARE_PROVIDER_SITE_OTHER): Payer: Medicare Other | Admitting: Internal Medicine

## 2015-11-08 ENCOUNTER — Encounter: Payer: Self-pay | Admitting: Internal Medicine

## 2015-11-08 VITALS — BP 126/83 | HR 97 | Temp 98.6°F | Wt 178.0 lb

## 2015-11-08 DIAGNOSIS — B182 Chronic viral hepatitis C: Secondary | ICD-10-CM | POA: Diagnosis not present

## 2015-11-08 LAB — COMPLETE METABOLIC PANEL WITH GFR
ALT: 12 U/L (ref 6–29)
AST: 16 U/L (ref 10–30)
Albumin: 4.1 g/dL (ref 3.6–5.1)
Alkaline Phosphatase: 68 U/L (ref 33–115)
BUN: 13 mg/dL (ref 7–25)
CO2: 24 mmol/L (ref 20–31)
Calcium: 8.9 mg/dL (ref 8.6–10.2)
Chloride: 107 mmol/L (ref 98–110)
Creat: 0.8 mg/dL (ref 0.50–1.10)
GFR, Est African American: >89 mL/min (ref >=60)
GFR, Est Non African American: >89 mL/min (ref >=60)
Glucose, Bld: 103 mg/dL — ABNORMAL HIGH (ref 65–99)
Potassium: 4 mmol/L (ref 3.5–5.3)
Sodium: 140 mmol/L (ref 135–146)
Total Bilirubin: 0.2 mg/dL (ref 0.2–1.2)
Total Protein: 6.6 g/dL (ref 6.1–8.1)

## 2015-11-08 NOTE — Progress Notes (Signed)
RFV: follow up for hepatitis C treatment  Patient ID: Alexis Steele, female   DOB: 20-Apr-1980, 35 y.o.   MRN: [id number]  HPI Alexis Steele is a 35yo F with chronic hepatitis C without hepatic coma, GT2b, with VL undetectable after 2 wks of tx. She is currently taking sof-vel has been taking her meds consistently and is close to finishing out her course. Tolerating without difficulty.  Outpatient Encounter Prescriptions as of 11/08/2015  Medication Sig  . Brexpiprazole (REXULTI) 1 MG TABS Take 1 tablet by mouth daily.  . busPIRone (BUSPAR) 10 MG tablet Take 10 mg by mouth 3 (three) times daily.  Marland Kitchen gabapentin (NEURONTIN) 400 MG capsule Take 400 mg by mouth 4 (four) times daily.  Marland Kitchen lamoTRIgine (LAMICTAL) 200 MG tablet Take 200 mg by mouth at bedtime.  Marland Kitchen PARoxetine (PAXIL) 40 MG tablet Take 40 mg by mouth every morning.  Marland Kitchen QUEtiapine (SEROQUEL) 300 MG tablet Take 300 mg by mouth at bedtime.  . Sofosbuvir-Velpatasvir 400-100 MG TABS Take 1 tablet by mouth daily.  . sucralfate (CARAFATE) 1 g tablet Take 1 g by mouth 4 (four) times daily.   No facility-administered encounter medications on file as of 11/08/2015.      Patient Active Problem List   Diagnosis Date Noted  . Abnormal Pap smear of vagina and vaginal HPV 07/11/2015  . Genital warts 07/11/2015  . Hepatitis C 07/11/2015     Health Maintenance Due  Topic Date Due  . HIV Screening  04/06/1993  . TETANUS/TDAP  04/06/1997  . PAP SMEAR  04/07/1999  . INFLUENZA VACCINE  08/30/2015     Review of Systems Review of Systems  Constitutional: Negative for fever, chills, diaphoresis, activity change, appetite change, fatigue and unexpected weight change.  HENT: Negative for congestion, sore throat, rhinorrhea, sneezing, trouble swallowing and sinus pressure.  Eyes: Negative for photophobia and visual disturbance.  Respiratory: Negative for cough, chest tightness, shortness of breath, wheezing and stridor.  Cardiovascular: Negative for  chest pain, palpitations and leg swelling.  Gastrointestinal: Negative for nausea, vomiting, abdominal pain, diarrhea, constipation, blood in stool, abdominal distention and anal bleeding.  Genitourinary: Negative for dysuria, hematuria, flank pain and difficulty urinating.  Musculoskeletal: Negative for myalgias, back pain, joint swelling, arthralgias and gait problem.  Skin: Negative for color change, pallor, rash and wound.  Neurological: Negative for dizziness, tremors, weakness and light-headedness.  Hematological: Negative for adenopathy. Does not bruise/bleed easily.  Psychiatric/Behavioral: Negative for behavioral problems, confusion, sleep disturbance, dysphoric mood, decreased concentration and agitation.    Physical Exam   BP 126/83   Pulse 97   Temp 98.6 F (37 C) (Oral)   Wt 80.7 kg (178 lb)   BMI 24.83 kg/m   Physical Exam  Constitutional:  oriented to person, place, and time. appears well-developed and well-nourished. No distress.  HENT: [place]/AT, PERRLA, no scleral icterus Mouth/Throat: Oropharynx is clear and moist. No oropharyngeal exudate.  Cardiovascular: Normal rate, regular rhythm and normal heart sounds. Exam reveals no gallop and no friction rub.  No murmur heard.  Pulmonary/Chest: Effort normal and breath sounds normal. No respiratory distress.  has no wheezes.  Neck = supple, no nuchal rigidity Abdominal: Soft. Bowel sounds are normal.  exhibits no distension. There is no tenderness.  Lymphadenopathy: no cervical adenopathy. No axillary adenopathy Neurological: alert and oriented to person, place, and time.  Skin: Skin is warm and dry. No rash noted. No erythema.  Psychiatric: a normal mood and affect.  behavior is normal.   Lab Results  Component Value Date   HEPBSAB POS (A) 06/14/2015   No results found for: RPR  CBC Lab Results  Component Value Date   WBC 4.4 07/14/2015   RBC 4.27 07/14/2015   HGB 14.3 07/14/2015   HCT 40.9 07/14/2015   PLT 289  07/14/2015   MCV 95.8 07/14/2015   MCH 33.5 (H) 07/14/2015   MCHC 35.0 07/14/2015   RDW 12.9 07/14/2015   LYMPHSABS 1,804 07/14/2015   MONOABS 352 07/14/2015   EOSABS 88 07/14/2015   BASOSABS 44 07/14/2015   BMET Lab Results  Component Value Date   NA 140 07/14/2015   K 4.2 07/14/2015   CL 107 07/14/2015   CO2 23 07/14/2015   GLUCOSE 78 07/14/2015   BUN 8 07/14/2015   CREATININE 0.78 07/14/2015   CALCIUM 9.5 07/14/2015   GFRNONAA >89 07/14/2015   GFRAA >89 07/14/2015     Assessment and Plan   Chronic hepatitis c without hepatic coma = will get blood work today. Have her come back in 12 wk for svr 12 and will repeat US at next 3-6 months  Avoid reinfection, alcohol use, tylenol use

## 2015-11-09 LAB — HEPATITIS C RNA QUANTITATIVE: HCV QUANT: NOT DETECTED [IU]/mL (ref <15)

## 2015-11-09 LAB — AFP TUMOR MARKER: AFP-Tumor Marker: 3.9 ng/mL (ref <6.1)

## 2015-11-16 NOTE — Telephone Encounter (Signed)
Enter in error

## 2015-12-19 ENCOUNTER — Telehealth: Payer: Self-pay | Admitting: *Deleted

## 2015-12-19 NOTE — Telephone Encounter (Signed)
Patient called for the results of her hep c viral load. Notified patient currently undetectable and also gave her the appt information for January with Dr. Baxter Flattery. She is aware she will have a repeat lab at that time. Myrtis Hopping

## 2015-12-20 ENCOUNTER — Telehealth: Payer: Self-pay

## 2015-12-20 NOTE — Telephone Encounter (Signed)
Voicemail from 12-19-15 Patient calling for lab results. She has completed the Hepatitis  C medication.   Laverle Patter, RN   Call completed on yesterday by Alexander Bergeron, CMA

## 2015-12-28 DIAGNOSIS — F4310 Post-traumatic stress disorder, unspecified: Secondary | ICD-10-CM | POA: Diagnosis not present

## 2016-01-11 DIAGNOSIS — F4310 Post-traumatic stress disorder, unspecified: Secondary | ICD-10-CM | POA: Diagnosis not present

## 2016-02-14 ENCOUNTER — Ambulatory Visit: Payer: Medicare Other | Admitting: Internal Medicine

## 2016-03-01 DIAGNOSIS — K219 Gastro-esophageal reflux disease without esophagitis: Secondary | ICD-10-CM | POA: Diagnosis not present

## 2016-03-01 DIAGNOSIS — M7550 Bursitis of unspecified shoulder: Secondary | ICD-10-CM | POA: Diagnosis not present

## 2016-03-06 ENCOUNTER — Ambulatory Visit (INDEPENDENT_AMBULATORY_CARE_PROVIDER_SITE_OTHER): Payer: Medicare Other | Admitting: Internal Medicine

## 2016-03-06 ENCOUNTER — Ambulatory Visit: Payer: Medicare Other

## 2016-03-06 ENCOUNTER — Encounter: Payer: Self-pay | Admitting: Internal Medicine

## 2016-03-06 VITALS — BP 114/82 | HR 76 | Temp 98.0°F | Ht 70.0 in | Wt 184.0 lb

## 2016-03-06 DIAGNOSIS — R300 Dysuria: Secondary | ICD-10-CM

## 2016-03-06 DIAGNOSIS — B182 Chronic viral hepatitis C: Secondary | ICD-10-CM

## 2016-03-06 DIAGNOSIS — Z23 Encounter for immunization: Secondary | ICD-10-CM | POA: Diagnosis not present

## 2016-03-06 NOTE — Progress Notes (Signed)
[facility]   RFV:  treatment for chronic hepatitis C  HPI:  +Alexis Steele is a 36 y.o. female with hronic hepatitis c without hepatic coma, GT2b, who finished out her treatment course at end of November, her VL was undetectable at that time. She is in clinic to do SVR 12 bloodwork. Fibrosis stage F0.   She is hep B immune, she received 1 dose of hep A   Review of Systems:   dysuria, urinary urgency concern for uti. Otherwise 10 point ros is negative     Past Medical History:  Diagnosis Date  . Anxiety   . Bipolar disorder ([place])   . PTSD (post-traumatic stress disorder)     Prior to Admission medications   Medication Sig Start Date End Date Taking? Authorizing Provider  busPIRone (BUSPAR) 10 MG tablet Take 10 mg by mouth 3 (three) times daily.   Yes Historical Provider, MD  gabapentin (NEURONTIN) 400 MG capsule Take 400 mg by mouth 4 (four) times daily.   Yes Historical Provider, MD  lamoTRIgine (LAMICTAL) 200 MG tablet Take 200 mg by mouth at bedtime.   Yes Historical Provider, MD  Oxcarbazepine (TRILEPTAL) 300 MG tablet Take 300 mg by mouth 2 (two) times daily.   Yes Historical Provider, MD  PARoxetine (PAXIL) 40 MG tablet Take 40 mg by mouth every morning.   Yes Historical Provider, MD  QUEtiapine (SEROQUEL) 300 MG tablet Take 300 mg by mouth at bedtime.   Yes Historical Provider, MD  sucralfate (CARAFATE) 1 g tablet Take 1 g by mouth 4 (four) times daily.   Yes Historical Provider, MD  Sofosbuvir-Velpatasvir 400-100 MG TABS Take 1 tablet by mouth daily. Patient not taking: Reported on 03/06/2016 08/10/15   Carlyle Basques, MD    Allergies  Allergen Reactions  . Augmentin [Amoxicillin-Pot Clavulanate]     Social History  Substance Use Topics  . Smoking status: Current Every Day Smoker  . Smokeless tobacco: Never Used  . Alcohol use Yes    No family history on file.   Objective:    Vitals:   03/06/16 1521  BP: 114/82  Pulse: 76    Temp: 98 F (36.7 C)   Constitutional:  oriented to person, place, and time. appears well-developed and well-nourished. No distress.  HENT: [place]/AT, PERRLA, no scleral icterus Mouth/Throat: Oropharynx is clear and moist. No oropharyngeal exudate.  Cardiovascular: Normal rate, regular rhythm and normal heart sounds. Exam reveals no gallop and no friction rub.  No murmur heard.  Pulmonary/Chest: Effort normal and breath sounds normal. No respiratory distress.  has no wheezes.  Neck = supple, no nuchal rigidity Abdominal: Soft. Bowel sounds are normal.  exhibits no distension. There is no tenderness.  Lymphadenopathy: no cervical adenopathy. No axillary adenopathy Neurological: alert and oriented to person, place, and time.  Skin: Skin is warm and dry. No rash noted. No erythema.  Psychiatric: a normal mood and affect.  behavior is normal.    Laboratory Genotype:  Lab Results  Component Value Date   HCVGENOTYPE 2b 07/14/2015   HCV viral load:  Lab Results  Component Value Date   HCVQUANT Not Detected 11/08/2015   Lab Results  Component Value Date   WBC 4.4 07/14/2015   HGB 14.3 07/14/2015   HCT 40.9 07/14/2015   MCV 95.8 07/14/2015   PLT 289 07/14/2015    Lab Results  Component Value Date   CREATININE 0.80 11/08/2015   BUN 13 11/08/2015   NA 140  11/08/2015   K 4.0 11/08/2015   CL 107 11/08/2015   CO2 24 11/08/2015    Lab Results  Component Value Date   ALT 12 11/08/2015   AST 16 11/08/2015   ALKPHOS 68 11/08/2015     Lab Results  Component Value Date   INR 1.05 06/14/2015   BILITOT 0.2 11/08/2015   ALBUMIN 4.1 11/08/2015     Assessment: New Patient with Chronic Hepatitis C genotype 2b, treated in nov 2017.   Plan: 1) Patient counseled extensively on limiting acetaminophen to no more than 2 grams daily, avoidance of alcohol. 2)  Hepatitis A vaccine #2 today 3)  will follow up after 12 months  4) dysuria = will check ua and urine cx. She reported having  occasional dysuria, urgency improved today  Addendum: too many squamous cells thus needed recollection is still symptomatic

## 2016-03-08 LAB — HEPATITIS C RNA QUANTITATIVE
HCV QUANT LOG: NOT DETECTED {Log_IU}/mL
HCV Quantitative: <15 IU/mL

## 2016-03-12 ENCOUNTER — Other Ambulatory Visit: Payer: Medicare Other

## 2016-03-12 ENCOUNTER — Other Ambulatory Visit: Payer: Self-pay | Admitting: Internal Medicine

## 2016-03-12 DIAGNOSIS — R300 Dysuria: Secondary | ICD-10-CM

## 2016-03-13 LAB — URINALYSIS W MICROSCOPIC + REFLEX CULTURE
Bilirubin Urine: NEGATIVE
Crystals: NONE SEEN [HPF]
GLUCOSE, UA: NEGATIVE
HGB URINE DIPSTICK: NEGATIVE
Ketones, ur: NEGATIVE
NITRITE: NEGATIVE
PH: 6 (ref 5.0–8.0)
Protein, ur: NEGATIVE
SPECIFIC GRAVITY, URINE: 1.022 (ref 1.001–1.035)
YEAST: NONE SEEN [HPF]

## 2016-03-14 LAB — URINE CULTURE

## 2016-05-01 DIAGNOSIS — F4310 Post-traumatic stress disorder, unspecified: Secondary | ICD-10-CM | POA: Diagnosis not present

## 2016-06-28 DIAGNOSIS — X58XXXA Exposure to other specified factors, initial encounter: Secondary | ICD-10-CM | POA: Diagnosis not present

## 2016-06-28 DIAGNOSIS — R300 Dysuria: Secondary | ICD-10-CM | POA: Diagnosis not present

## 2016-06-28 DIAGNOSIS — K759 Inflammatory liver disease, unspecified: Secondary | ICD-10-CM | POA: Diagnosis not present

## 2016-06-28 DIAGNOSIS — Z7251 High risk heterosexual behavior: Secondary | ICD-10-CM | POA: Diagnosis not present

## 2016-06-28 DIAGNOSIS — Z8639 Personal history of other endocrine, nutritional and metabolic disease: Secondary | ICD-10-CM | POA: Diagnosis not present

## 2016-07-11 DIAGNOSIS — F4310 Post-traumatic stress disorder, unspecified: Secondary | ICD-10-CM | POA: Diagnosis not present

## 2016-08-28 DIAGNOSIS — R350 Frequency of micturition: Secondary | ICD-10-CM | POA: Diagnosis not present

## 2016-08-28 DIAGNOSIS — R3914 Feeling of incomplete bladder emptying: Secondary | ICD-10-CM | POA: Diagnosis not present

## 2016-08-28 DIAGNOSIS — R351 Nocturia: Secondary | ICD-10-CM | POA: Diagnosis not present
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
## 2017-02-23 DIAGNOSIS — S0181XA Laceration without foreign body of other part of head, initial encounter: Secondary | ICD-10-CM | POA: Diagnosis not present

## 2017-02-23 DIAGNOSIS — Z23 Encounter for immunization: Secondary | ICD-10-CM | POA: Diagnosis not present

## 2017-02-23 DIAGNOSIS — Y999 Unspecified external cause status: Secondary | ICD-10-CM | POA: Diagnosis not present

## 2017-02-23 DIAGNOSIS — Y92410 Unspecified street and highway as the place of occurrence of the external cause: Secondary | ICD-10-CM | POA: Diagnosis not present

## 2017-02-23 DIAGNOSIS — S01512A Laceration without foreign body of oral cavity, initial encounter: Secondary | ICD-10-CM | POA: Diagnosis not present

## 2017-04-25 DIAGNOSIS — L309 Dermatitis, unspecified: Secondary | ICD-10-CM | POA: Diagnosis not present

## 2017-04-25 DIAGNOSIS — K219 Gastro-esophageal reflux disease without esophagitis: Secondary | ICD-10-CM | POA: Diagnosis not present

## 2017-04-25 DIAGNOSIS — M549 Dorsalgia, unspecified: Secondary | ICD-10-CM | POA: Diagnosis not present

## 2017-10-21 DIAGNOSIS — S92352A Displaced fracture of fifth metatarsal bone, left foot, initial encounter for closed fracture: Secondary | ICD-10-CM | POA: Diagnosis not present

## 2017-10-21 DIAGNOSIS — W1789XA Other fall from one level to another, initial encounter: Secondary | ICD-10-CM | POA: Diagnosis not present

## 2017-10-21 DIAGNOSIS — S92355A Nondisplaced fracture of fifth metatarsal bone, left foot, initial encounter for closed fracture: Secondary | ICD-10-CM | POA: Diagnosis not present

## 2017-10-21 DIAGNOSIS — S9032XA Contusion of left foot, initial encounter: Secondary | ICD-10-CM | POA: Diagnosis not present

## 2017-10-21 DIAGNOSIS — Y999 Unspecified external cause status: Secondary | ICD-10-CM | POA: Diagnosis not present

## 2017-10-21 DIAGNOSIS — S99922A Unspecified injury of left foot, initial encounter: Secondary | ICD-10-CM | POA: Diagnosis not present

## 2017-10-24 DIAGNOSIS — S92355A Nondisplaced fracture of fifth metatarsal bone, left foot, initial encounter for closed fracture: Secondary | ICD-10-CM | POA: Diagnosis not present

## 2017-11-14 DIAGNOSIS — S92355A Nondisplaced fracture of fifth metatarsal bone, left foot, initial encounter for closed fracture: Secondary | ICD-10-CM | POA: Diagnosis not present

## 2018-09-26 DIAGNOSIS — Z202 Contact with and (suspected) exposure to infections with a predominantly sexual mode of transmission: Secondary | ICD-10-CM | POA: Diagnosis not present

## 2018-09-26 DIAGNOSIS — N760 Acute vaginitis: Secondary | ICD-10-CM | POA: Diagnosis not present

## 2018-09-26 DIAGNOSIS — Z113 Encounter for screening for infections with a predominantly sexual mode of transmission: Secondary | ICD-10-CM | POA: Diagnosis not present

## 2018-09-26 DIAGNOSIS — Z114 Encounter for screening for human immunodeficiency virus [HIV]: Secondary | ICD-10-CM | POA: Diagnosis not present

## 2019-10-01 ENCOUNTER — Other Ambulatory Visit: Payer: Self-pay | Admitting: Nurse Practitioner

## 2019-10-01 DIAGNOSIS — N898 Other specified noninflammatory disorders of vagina: Secondary | ICD-10-CM | POA: Diagnosis not present

## 2019-10-01 DIAGNOSIS — Z7251 High risk heterosexual behavior: Secondary | ICD-10-CM | POA: Diagnosis not present

## 2019-10-01 DIAGNOSIS — E669 Obesity, unspecified: Secondary | ICD-10-CM | POA: Diagnosis not present

## 2019-10-01 DIAGNOSIS — K219 Gastro-esophageal reflux disease without esophagitis: Secondary | ICD-10-CM | POA: Diagnosis not present

## 2019-10-01 DIAGNOSIS — Z0000 Encounter for general adult medical examination without abnormal findings: Secondary | ICD-10-CM | POA: Diagnosis not present

## 2020-01-21 ENCOUNTER — Other Ambulatory Visit: Payer: Self-pay | Admitting: Nurse Practitioner

## 2020-01-21 DIAGNOSIS — R5381 Other malaise: Secondary | ICD-10-CM

## 2020-03-17 ENCOUNTER — Other Ambulatory Visit: Payer: Self-pay | Admitting: Nurse Practitioner

## 2020-03-17 DIAGNOSIS — N6320 Unspecified lump in the left breast, unspecified quadrant: Secondary | ICD-10-CM

## 2020-05-02 ENCOUNTER — Ambulatory Visit
Admission: RE | Admit: 2020-05-02 | Discharge: 2020-05-02 | Disposition: A | Discharge status: Home / Self Care | Payer: Medicare Other | Source: Ambulatory Visit | Attending: Nurse Practitioner | Admitting: Nurse Practitioner

## 2020-05-02 ENCOUNTER — Other Ambulatory Visit: Payer: Self-pay

## 2020-05-02 DIAGNOSIS — N6320 Unspecified lump in the left breast, unspecified quadrant: Secondary | ICD-10-CM

## 2022-03-25 IMAGING — US US BREAST*L* LIMITED INC AXILLA
1 series · 7 of 7 positions shown · non-contrast
Comparison: None.

CLINICAL DATA: Mass felt by the patient in the upper inner left
breast for the past 2 years. She reports that this has increased
some in size and has become painful. Her sister was diagnosed with
breast cancer in her mid 40s.

EXAM:
DIGITAL DIAGNOSTIC BILATERAL MAMMOGRAM WITH TOMOSYNTHESIS AND CAD;
ULTRASOUND LEFT BREAST LIMITED
TECHNIQUE: Bilateral digital diagnostic mammography and breast tomosynthesis
was performed. The images were evaluated with computer-aided
detection.; Targeted ultrasound examination of the left breast was
performed

[Series 1: us breast*left* limited inc axilla · 0.07mm/px · 7 of 7 slices shown]
[im 1/7]
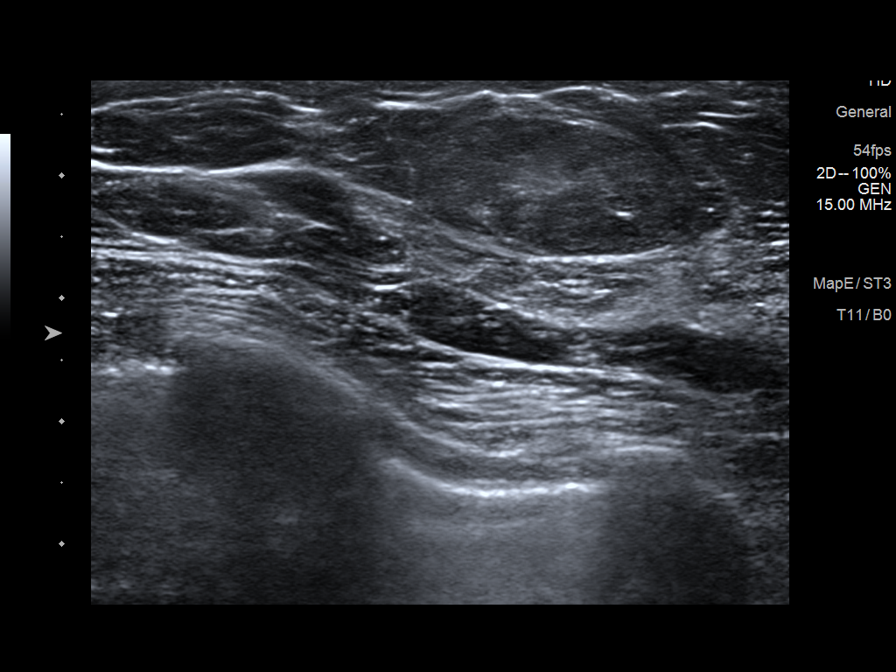
[im 2/7]
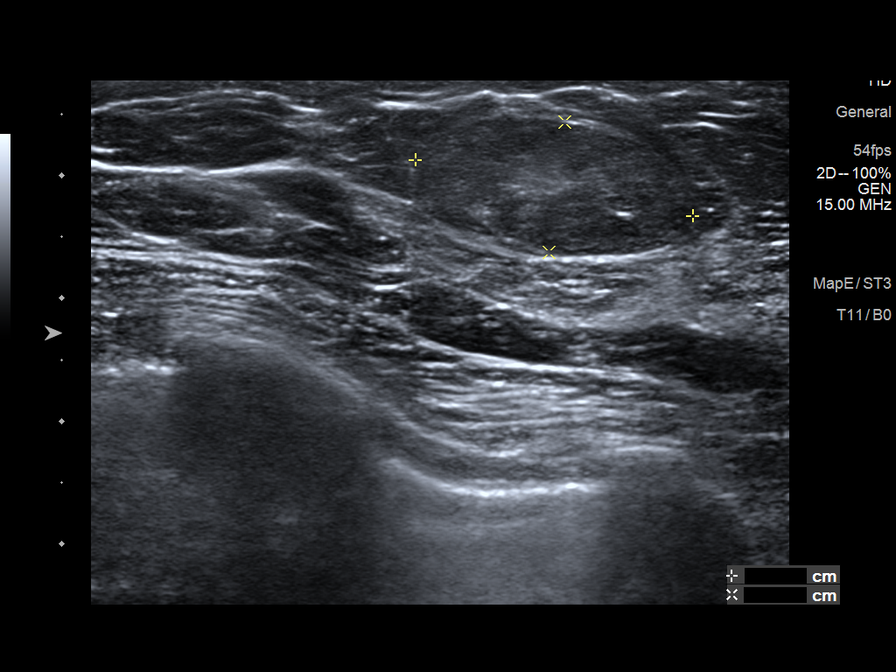
[im 3/7]
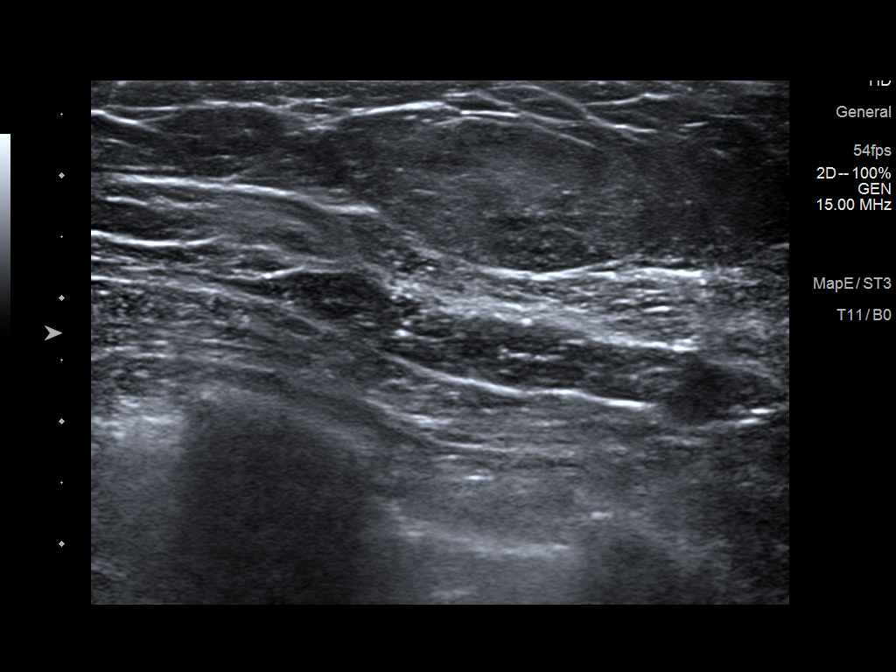
[im 4/7]
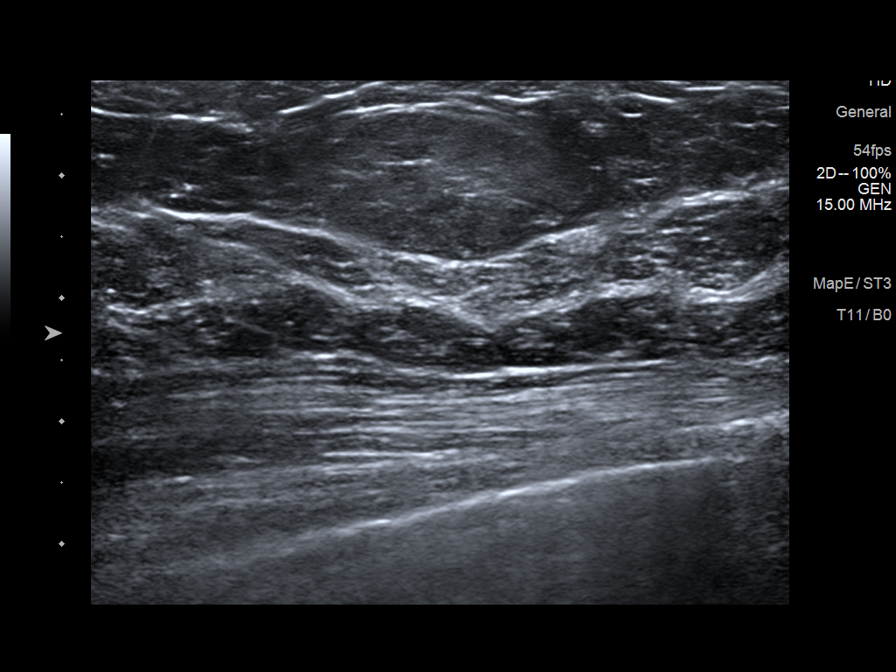
[im 5/7]
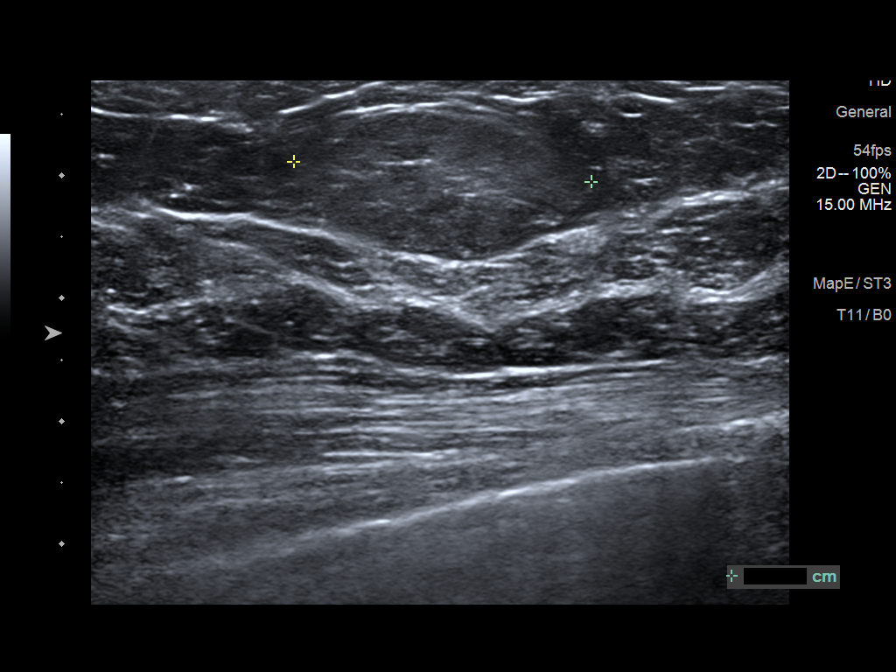
[im 6/7]
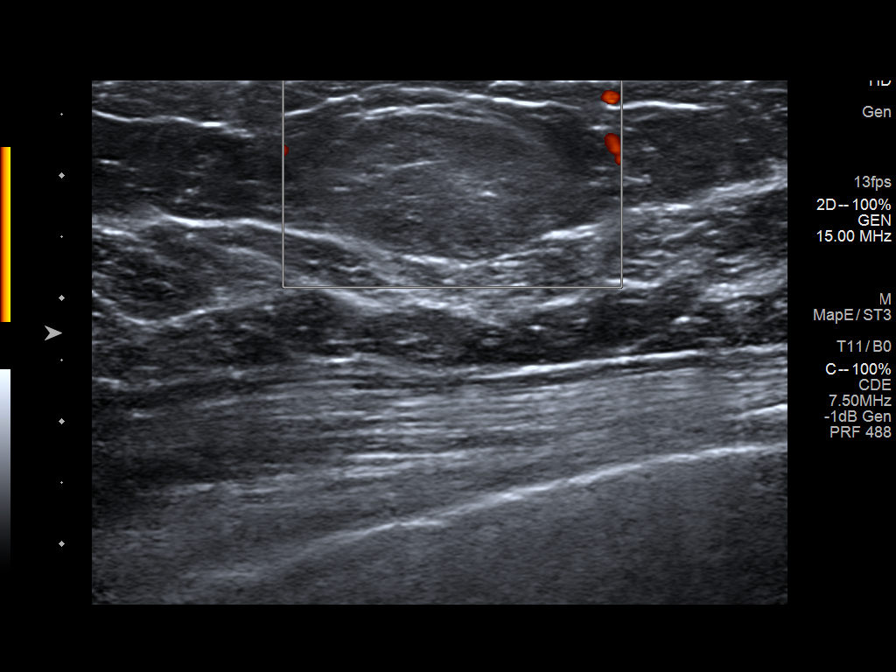
[im 7/7]
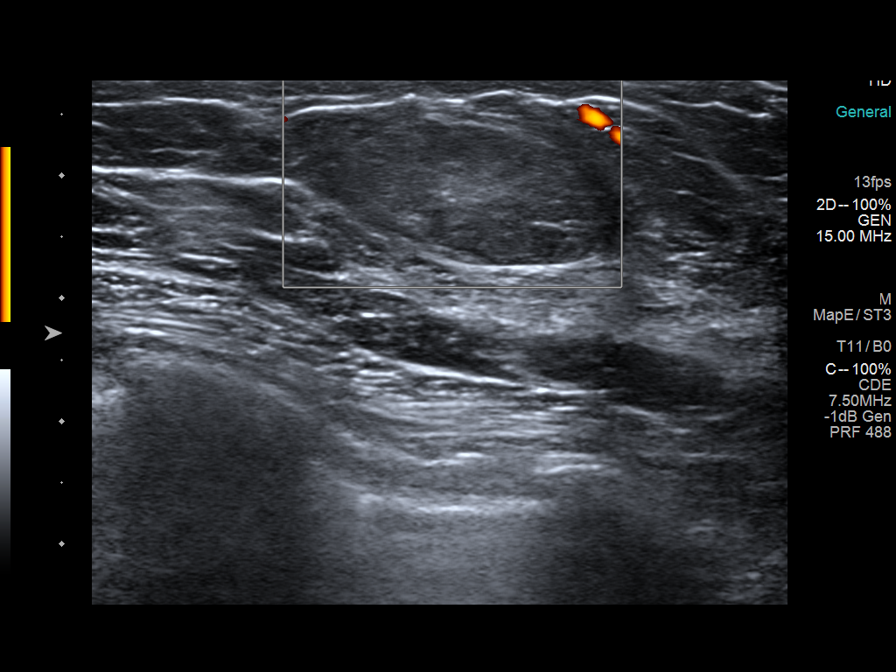

[7 of 7 positions shown; findings below may reference images not displayed]

Baseline.

ACR Breast Density Category b: There are scattered areas of
fibroglandular density.
FINDINGS: Oval, circumscribed, fat density mass with a thin surrounding
capsule in the upper inner left breast corresponding to the mass
felt by the patient, marked with a metallic marker. No findings in
either breast suspicious for malignancy.

On physical exam, the patient has an approximately 1.5 cm oval,
circumscribed, palpable mass in the 10:30 o'clock position of the
left breast, 5 cm from the nipple. This is tender to palpation.

Targeted ultrasound is performed, showing a 2.4 x 2.3 x 1.1 cm oval,
horizontally oriented, predominantly hypoechoic mass with some
ill-defined increased echogenicity in the 10:30 o'clock position of
the left breast, 5 cm from the nipple. This is within the
subcutaneous fat and corresponds to the palpable mass. No internal
blood flow was seen power Doppler.
IMPRESSION: 1. 2.4 cm palpable lipoma in the 10:30 o'clock position of the left
breast.
2. No evidence of malignancy in either breast.

RECOMMENDATION:
Bilateral screening mammogram in 1 year.

I have discussed the findings and recommendations with the patient.
If applicable, a reminder letter will be sent to the patient
regarding the next appointment.

BI-RADS CATEGORY  2: Benign.
